# Patient Record
Sex: Female | Born: 2000 | Race: Black or African American | Hispanic: No | Marital: Single | Smoking: Never smoker
Health system: Southern US, Community
[De-identification: ages and names within clinical notes are randomized; demographics above are authoritative.]

## PROBLEM LIST (undated history)

## (undated) HISTORY — PX: OTHER SURGICAL HISTORY: SHX169

---
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
## 2022-05-16 ENCOUNTER — Encounter: Payer: Self-pay | Admitting: Internal Medicine

## 2022-05-16 ENCOUNTER — Ambulatory Visit (INDEPENDENT_AMBULATORY_CARE_PROVIDER_SITE_OTHER): Payer: BC Managed Care – PPO | Admitting: Internal Medicine

## 2022-05-16 VITALS — BP 111/69 | HR 91 | Ht 66.0 in | Wt 110.4 lb

## 2022-05-16 DIAGNOSIS — Z0001 Encounter for general adult medical examination with abnormal findings: Secondary | ICD-10-CM | POA: Diagnosis not present

## 2022-05-16 DIAGNOSIS — R636 Underweight: Secondary | ICD-10-CM | POA: Diagnosis not present

## 2022-05-16 DIAGNOSIS — Z1159 Encounter for screening for other viral diseases: Secondary | ICD-10-CM

## 2022-05-16 DIAGNOSIS — Z114 Encounter for screening for human immunodeficiency virus [HIV]: Secondary | ICD-10-CM | POA: Diagnosis not present

## 2022-05-16 DIAGNOSIS — G8929 Other chronic pain: Secondary | ICD-10-CM | POA: Insufficient documentation

## 2022-05-16 DIAGNOSIS — Z23 Encounter for immunization: Secondary | ICD-10-CM | POA: Diagnosis not present

## 2022-05-16 DIAGNOSIS — M545 Low back pain, unspecified: Secondary | ICD-10-CM | POA: Diagnosis not present

## 2022-05-16 NOTE — Assessment & Plan Note (Signed)
Reports chronic low back pain, likely due to heavy lifting at her previous job Prefers to avoid oral agent Advised to apply IcyHot or Voltaren gel Avoid heavy lifting and frequent bending Simple back exercises

## 2022-05-16 NOTE — Patient Instructions (Addendum)
Please apply Voltaren gel for back pain.  Please start taking protein supplements once daily.  Please take at least 3 meals in a day and avoid skipping any meals.  HPV vaccine series: Dose 1: Today Dose 2: After 1 month (06/16/22) Dose 3: After 6 months (11/16/22)

## 2022-05-16 NOTE — Progress Notes (Signed)
New Patient Office Visit  Subjective:  Patient ID: Jodi Lowe, female    DOB: [birth year]  Age: 22 y.o. MRN: [id number]  CC:  Chief Complaint  Patient presents with   Annual Exam    HPI Jodi Lowe is a 22 y.o. female with no significant past medical history who presents for establishing care and annual exam.  She reports chronic low back pain, likely due to heavy lifting at [employer].  She is not working at [employer] now.  Denies any recent injury.  Her back pain is intermittent, worse with bending and better with rest.  She has not tried any OTC medicines yet.  Denies any saddle anesthesia, urinary or stool incontinence, numbness or tingling of the LE.  She reports vaping almost on a daily basis, but denies smoking cigarettes.  Denies any dyspnea or wheezing currently.  History reviewed. No pertinent past medical history.  Past Surgical History:  Procedure Laterality Date   Finger reset     Pts. pinky finger had to be reset back when she was in [employer]    Family History  Problem Relation Age of Onset   Diabetes Mother    Hypertension Mother    Hypertension Father    Sickle cell trait Father     Social History   Socioeconomic History   Marital status: Single    Spouse name: Not on file   Number of children: Not on file   Years of education: Not on file   Highest education level: Not on file  Occupational History   Not on file  Tobacco Use   Smoking status: Never   Smokeless tobacco: Never  Vaping Use   Vaping Use: Every day   Substances: Nicotine, THC, Flavoring  Substance and Sexual Activity   Alcohol use: No    Comment: occ   Drug use: Yes    Frequency: 7.0 times per week    Types: Marijuana   Sexual activity: Never  Other Topics Concern   Not on file  Social History Narrative   Not on file   Social Determinants of Health   Financial Resource Strain: Not on file  Food Insecurity: Not on file  Transportation Needs: Not on file  Physical  Activity: Not on file  Stress: Not on file  Social Connections: Not on file  Intimate Partner Violence: Not on file    ROS Review of Systems  Constitutional:  Negative for chills and fever.  HENT:  Negative for congestion, sinus pressure, sinus pain and sore throat.   Eyes:  Negative for pain and discharge.  Respiratory:  Negative for cough and shortness of breath.   Cardiovascular:  Negative for chest pain and palpitations.  Gastrointestinal:  Negative for abdominal pain, constipation, diarrhea, nausea and vomiting.  Endocrine: Negative for polydipsia and polyuria.  Genitourinary:  Negative for dysuria and hematuria.  Musculoskeletal:  Positive for back pain. Negative for neck pain and neck stiffness.  Skin:  Negative for rash.  Neurological:  Negative for dizziness and weakness.  Psychiatric/Behavioral:  Negative for agitation and behavioral problems.     Objective:   Today's Vitals: BP 111/69 (BP Location: Right Arm, Patient Position: Sitting, Cuff Size: Normal)   Pulse 91   Ht '5\' 6"'$  (1.676 m)   Wt 110 lb 6.4 oz (50.1 kg)   SpO2 96%   BMI 17.82 kg/m   Physical Exam Vitals reviewed.  Constitutional:      General: She is not in acute distress.  Appearance: She is not diaphoretic.  HENT:     Head: Normocephalic and atraumatic.     Nose: Nose normal.     Mouth/Throat:     Mouth: Mucous membranes are moist.  Eyes:     General: No scleral icterus.    Extraocular Movements: Extraocular movements intact.  Cardiovascular:     Rate and Rhythm: Normal rate and regular rhythm.     Heart sounds: Normal heart sounds. No murmur heard. Pulmonary:     Breath sounds: Normal breath sounds. No wheezing or rales.  Abdominal:     Palpations: Abdomen is soft.     Tenderness: There is no abdominal tenderness.  Musculoskeletal:     Cervical back: Neck supple. No tenderness.     Lumbar back: No tenderness.     Right lower leg: No edema.     Left lower leg: No edema.  Skin:     General: Skin is warm.     Findings: No rash.     Comments: Tattoos over b/l forearm  Neurological:     General: No focal deficit present.     Mental Status: She is alert and oriented to person, place, and time.  Psychiatric:        Mood and Affect: Mood normal.        Behavior: Behavior normal.     Assessment & Plan:   Problem List Items Addressed This Visit       Other   Encounter for general adult medical examination with abnormal findings - Primary    Physical exam as documented. Fasting blood tests today. HPV vaccine #1 today.       Relevant Orders   CBC with Differential/Platelet   Basic Metabolic Panel (BMET)   Chronic low back pain without sciatica    Reports chronic low back pain, likely due to heavy lifting at her previous job Prefers to avoid oral agent Advised to apply IcyHot or Voltaren gel Avoid heavy lifting and frequent bending Simple back exercises      Underweight    Skips breakfast, advised to take at least 3 meals in a day Avoid skipping meals Needs to take protein supplement Needs to cut down to quit vaping      Other Visit Diagnoses     Need for hepatitis C screening test       Relevant Orders   Hepatitis C Antibody   Screening for HIV (human immunodeficiency virus)       Relevant Orders   HIV antibody (with reflex)   Need for HPV vaccination       Relevant Orders   HPV 9-valent vaccine,Recombinat (Completed)       Outpatient Encounter Medications as of 05/16/2022  Medication Sig   Ascorbic Acid (VITAMIN C) 1000 MG tablet Take 1,000 mg by mouth daily. When having cold symptoms. (Patient not taking: Reported on 05/16/2022)   ondansetron (ZOFRAN ODT) 4 MG disintegrating tablet '4mg'$  ODT q6 hours prn nausea/vomit (Patient not taking: Reported on 05/16/2022)   No facility-administered encounter medications on file as of 05/16/2022.    Follow-up: Return in about 1 year (around 05/16/2023) for Annual exam.   Lindell Spar, MD

## 2022-05-16 NOTE — Assessment & Plan Note (Signed)
Physical exam as documented. Fasting blood tests today. HPV vaccine #1 today.

## 2022-05-16 NOTE — Assessment & Plan Note (Signed)
Skips breakfast, advised to take at least 3 meals in a day Avoid skipping meals Needs to take protein supplement Needs to cut down to quit vaping

## 2022-05-17 LAB — CBC WITH DIFFERENTIAL/PLATELET
Basophils Absolute: 0 10*3/uL (ref 0.0–0.2)
Basos: 0 %
EOS (ABSOLUTE): 0 10*3/uL (ref 0.0–0.4)
Eos: 1 %
Hematocrit: 38.7 % (ref 34.0–46.6)
Hemoglobin: 12.9 g/dL (ref 11.1–15.9)
Immature Grans (Abs): 0 10*3/uL (ref 0.0–0.1)
Immature Granulocytes: 0 %
Lymphocytes Absolute: 1.5 10*3/uL (ref 0.7–3.1)
Lymphs: 24 %
MCH: 28.9 pg (ref 26.6–33.0)
MCHC: 33.3 g/dL (ref 31.5–35.7)
MCV: 87 fL (ref 79–97)
Monocytes Absolute: 0.3 10*3/uL (ref 0.1–0.9)
Monocytes: 5 %
Neutrophils Absolute: 4.3 10*3/uL (ref 1.4–7.0)
Neutrophils: 70 %
Platelets: 198 10*3/uL (ref 150–450)
RBC: 4.46 x10E6/uL (ref 3.77–5.28)
RDW: 15.2 % (ref 11.7–15.4)
WBC: 6.2 10*3/uL (ref 3.4–10.8)

## 2022-05-17 LAB — BASIC METABOLIC PANEL
BUN/Creatinine Ratio: 12 (ref 9–23)
BUN: 10 mg/dL (ref 6–20)
CO2: 25 mmol/L (ref 20–29)
Calcium: 9.5 mg/dL (ref 8.7–10.2)
Chloride: 103 mmol/L (ref 96–106)
Creatinine, Ser: 0.82 mg/dL (ref 0.57–1.00)
Glucose: 84 mg/dL (ref 70–99)
Potassium: 4.9 mmol/L (ref 3.5–5.2)
Sodium: 140 mmol/L (ref 134–144)
eGFR: 105 mL/min/{1.73_m2} (ref >59)

## 2022-05-17 LAB — HEPATITIS C ANTIBODY: Hep C Virus Ab: NONREACTIVE

## 2022-05-17 LAB — HIV ANTIBODY (ROUTINE TESTING W REFLEX): HIV Screen 4th Generation wRfx: NONREACTIVE

## 2022-05-31 ENCOUNTER — Other Ambulatory Visit: Payer: Self-pay

## 2022-05-31 ENCOUNTER — Encounter (HOSPITAL_COMMUNITY): Payer: Self-pay

## 2022-05-31 ENCOUNTER — Emergency Department (HOSPITAL_COMMUNITY)
Admission: EM | Admit: 2022-05-31 | Discharge: 2022-05-31 | Disposition: A | Discharge status: Home / Self Care | Payer: BC Managed Care – PPO | Attending: Emergency Medicine | Admitting: Emergency Medicine

## 2022-05-31 DIAGNOSIS — F17210 Nicotine dependence, cigarettes, uncomplicated: Secondary | ICD-10-CM | POA: Diagnosis not present

## 2022-05-31 DIAGNOSIS — D72829 Elevated white blood cell count, unspecified: Secondary | ICD-10-CM | POA: Insufficient documentation

## 2022-05-31 DIAGNOSIS — R112 Nausea with vomiting, unspecified: Secondary | ICD-10-CM | POA: Diagnosis not present

## 2022-05-31 DIAGNOSIS — F12188 Cannabis abuse with other cannabis-induced disorder: Secondary | ICD-10-CM | POA: Insufficient documentation

## 2022-05-31 DIAGNOSIS — R9431 Abnormal electrocardiogram [ECG] [EKG]: Secondary | ICD-10-CM | POA: Diagnosis not present

## 2022-05-31 LAB — CBC WITH DIFFERENTIAL/PLATELET
Abs Immature Granulocytes: 0.06 10*3/uL (ref 0.00–0.07)
Basophils Absolute: 0 10*3/uL (ref 0.0–0.1)
Basophils Relative: 0 %
Eosinophils Absolute: 0 10*3/uL (ref 0.0–0.5)
Eosinophils Relative: 0 %
HCT: 35 % — ABNORMAL LOW (ref 36.0–46.0)
Hemoglobin: 11.6 g/dL — ABNORMAL LOW (ref 12.0–15.0)
Immature Granulocytes: 0 %
Lymphocytes Relative: 7 %
Lymphs Abs: 0.9 10*3/uL (ref 0.7–4.0)
MCH: 28.5 pg (ref 26.0–34.0)
MCHC: 33.1 g/dL (ref 30.0–36.0)
MCV: 86 fL (ref 80.0–100.0)
Monocytes Absolute: 0.5 10*3/uL (ref 0.1–1.0)
Monocytes Relative: 4 %
Neutro Abs: 12.2 10*3/uL — ABNORMAL HIGH (ref 1.7–7.7)
Neutrophils Relative %: 89 %
Platelets: 199 10*3/uL (ref 150–400)
RBC: 4.07 MIL/uL (ref 3.87–5.11)
RDW: 14.6 % (ref 11.5–15.5)
WBC: 13.7 10*3/uL — ABNORMAL HIGH (ref 4.0–10.5)
nRBC: 0 % (ref 0.0–0.2)

## 2022-05-31 LAB — URINALYSIS, ROUTINE W REFLEX MICROSCOPIC
Bilirubin Urine: NEGATIVE
Glucose, UA: NEGATIVE mg/dL
Ketones, ur: NEGATIVE mg/dL
Nitrite: NEGATIVE
Protein, ur: 100 mg/dL — AB
RBC / HPF: >50 RBC/hpf (ref 0–5)
Specific Gravity, Urine: 1.014 (ref 1.005–1.030)
pH: 8 (ref 5.0–8.0)

## 2022-05-31 LAB — COMPREHENSIVE METABOLIC PANEL
ALT: 15 U/L (ref 0–44)
AST: 28 U/L (ref 15–41)
Albumin: 4.4 g/dL (ref 3.5–5.0)
Alkaline Phosphatase: 65 U/L (ref 38–126)
Anion gap: 12 (ref 5–15)
BUN: 11 mg/dL (ref 6–20)
CO2: 20 mmol/L — ABNORMAL LOW (ref 22–32)
Calcium: 9.4 mg/dL (ref 8.9–10.3)
Chloride: 108 mmol/L (ref 98–111)
Creatinine, Ser: 0.8 mg/dL (ref 0.44–1.00)
GFR, Estimated: >60 mL/min (ref >60)
Glucose, Bld: 122 mg/dL — ABNORMAL HIGH (ref 70–99)
Potassium: 3.7 mmol/L (ref 3.5–5.1)
Sodium: 140 mmol/L (ref 135–145)
Total Bilirubin: 0.8 mg/dL (ref 0.3–1.2)
Total Protein: 7 g/dL (ref 6.5–8.1)

## 2022-05-31 LAB — HCG, QUANTITATIVE, PREGNANCY: hCG, Beta Chain, Quant, S: <1 m[IU]/mL (ref <5)

## 2022-05-31 MED ORDER — ONDANSETRON 8 MG PO TBDP: 8.0000 mg | ORAL_TABLET | Freq: Three times a day (TID) | ORAL | 0 refills | Status: DC | PRN

## 2022-05-31 MED ORDER — HALOPERIDOL LACTATE 5 MG/ML IJ SOLN: 2.0000 mg | Freq: Once | INTRAMUSCULAR | Status: DC

## 2022-05-31 MED ORDER — FENTANYL CITRATE PF 50 MCG/ML IJ SOSY
50.0000 ug | PREFILLED_SYRINGE | Freq: Once | INTRAMUSCULAR | Status: AC
  Administered 2022-05-31: 50 ug via INTRAVENOUS
  Filled 2022-05-31: qty 1

## 2022-05-31 MED ORDER — LACTATED RINGERS IV BOLUS
1000.0000 mL | Freq: Once | INTRAVENOUS | Status: AC
  Administered 2022-05-31: 1000 mL via INTRAVENOUS

## 2022-05-31 MED ORDER — DROPERIDOL 2.5 MG/ML IJ SOLN
1.2500 mg | Freq: Once | INTRAMUSCULAR | Status: AC
  Administered 2022-05-31: 1.25 mg via INTRAVENOUS
  Filled 2022-05-31: qty 2

## 2022-05-31 MED ORDER — LACTATED RINGERS IV SOLN: INTRAVENOUS | Status: DC

## 2022-05-31 NOTE — ED Provider Notes (Signed)
[place] EMERGENCY DEPARTMENT AT [facility] Provider Note   CSN: [id number] Arrival date & time: 05/31/22  1205     History  Chief Complaint  Patient presents with   Abdominal Pain   Emesis    Jodi Lowe is a 22 y.o. female.  22 year old female presents with 1 day of nausea vomiting.  She also notes diffuse abdominal cramping.  Patient smokes PE marijuana cigarettes a day.  Denies any fever or chills.  Emesis has been nonbilious or bloody.  She is currently on her menstrual cycle.  Denies any urinary symptoms.  Went to urgent care and EMS was called to that location and patient transported here      Home Medications Prior to Admission medications   Medication Sig Start Date End Date Taking? Authorizing Provider  Ascorbic Acid (VITAMIN C) 1000 MG tablet Take 1,000 mg by mouth daily. When having cold symptoms. Patient not taking: Reported on 05/16/2022    [provider]  ondansetron (ZOFRAN ODT) 4 MG disintegrating tablet 4mg  ODT q6 hours prn nausea/vomit Patient not taking: Reported on 05/16/2022 02/22/13   Drenda Freeze, MD      Allergies    Amoxicillin    Review of Systems   Review of Systems  All other systems reviewed and are negative.   Physical Exam Updated Vital Signs BP 120/89   Pulse (!) 45   Temp 97.6 F (36.4 C)   Resp 20   Ht 1.676 m (5\' 6" )   Wt 49.9 kg   SpO2 100%   BMI 17.75 kg/m  Physical Exam Vitals and nursing note reviewed.  Constitutional:      General: She is not in acute distress.    Appearance: Normal appearance. She is well-developed. She is not toxic-appearing.  HENT:     Head: Normocephalic and atraumatic.  Eyes:     General: Lids are normal.     Conjunctiva/sclera: Conjunctivae normal.     Pupils: Pupils are equal, round, and reactive to light.  Neck:     Thyroid: No thyroid mass.     Trachea: No tracheal deviation.  Cardiovascular:     Rate and Rhythm: Normal rate and regular rhythm.      Heart sounds: Normal heart sounds. No murmur heard.    No gallop.  Pulmonary:     Effort: Pulmonary effort is normal. No respiratory distress.     Breath sounds: Normal breath sounds. No stridor. No decreased breath sounds, wheezing, rhonchi or rales.  Abdominal:     General: There is no distension.     Palpations: Abdomen is soft.     Tenderness: There is no abdominal tenderness. There is no rebound.    Musculoskeletal:        General: No tenderness. Normal range of motion.     Cervical back: Normal range of motion and neck supple.  Skin:    General: Skin is warm and dry.     Findings: No abrasion or rash.  Neurological:     Mental Status: She is alert and oriented to person, place, and time. Mental status is at baseline.     GCS: GCS eye subscore is 4. GCS verbal subscore is 5. GCS motor subscore is 6.     Cranial Nerves: No cranial nerve deficit.     Sensory: No sensory deficit.     Motor: Motor function is intact.  Psychiatric:        Attention and Perception: Attention normal.  Speech: Speech normal.        Behavior: Behavior normal.    ED Results / Procedures / Treatments   Labs (all labs ordered are listed, but only abnormal results are displayed) Labs Reviewed - No data to display  EKG EKG Interpretation  Date/Time:  Thursday May 31 2022 12:19:27 EDT Ventricular Rate:  54 PR Interval:  104 QRS Duration: 79 QT Interval:  428 QTC Calculation: 406 R Axis:   77 Text Interpretation: Sinus rhythm Atrial premature complexes Short PR interval ST elev, probable normal early repol pattern Confirmed by Lacretia Leigh ([id number]) on 05/31/2022 3:23:47 PM  Radiology No results found.  Procedures Procedures    Medications Ordered in ED Medications  lactated ringers infusion (has no administration in time range)  lactated ringers bolus 1,000 mL (has no administration in time range)  haloperidol lactate (HALDOL) injection 2 mg (has no administration in time range)     ED Course/ Medical Decision Making/ A&P                             Medical Decision Making Amount and/or Complexity of Data Reviewed Labs: ordered.  Risk Prescription drug management.   Patient is EKG my interpretation shows no ischemic changes.  Patient medicated with droperidol and fentanyl and feels better.  I will give IV fluids.  Laboratory studies noted for slight leukocytosis.  She has no focal tenderness.  Urinalysis with possible infection.  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  Abdominal exam repeated after medication and is benign at this point.  Patient does not appear to have a surgical abdomen at this time.  Will discharge home with antiemetics patient counseled on dangers of marijuana use        Final Clinical Impression(s) / ED Diagnoses Final diagnoses:  None    Rx / DC Orders ED Discharge Orders     None         Lacretia Leigh, MD 05/31/22 1525

## 2022-05-31 NOTE — ED Triage Notes (Signed)
BIB EMS from urgent care for generalized abdominal pain and vomiting since this AM. No diarrhea. No other c/o

## 2022-08-05 IMAGING — DX DG HAND COMPLETE 3+V*L*
4 series · 4 of 4 positions shown · non-contrast
Comparison: None.

CLINICAL DATA: Bilateral hand pain, across all 4 MCP joints on rt
hand worse on middle finger, left hand pain to 0rd-2th MCP joints.

EXAM:
LEFT HAND - COMPLETE 3+ VIEW

[hand pa]
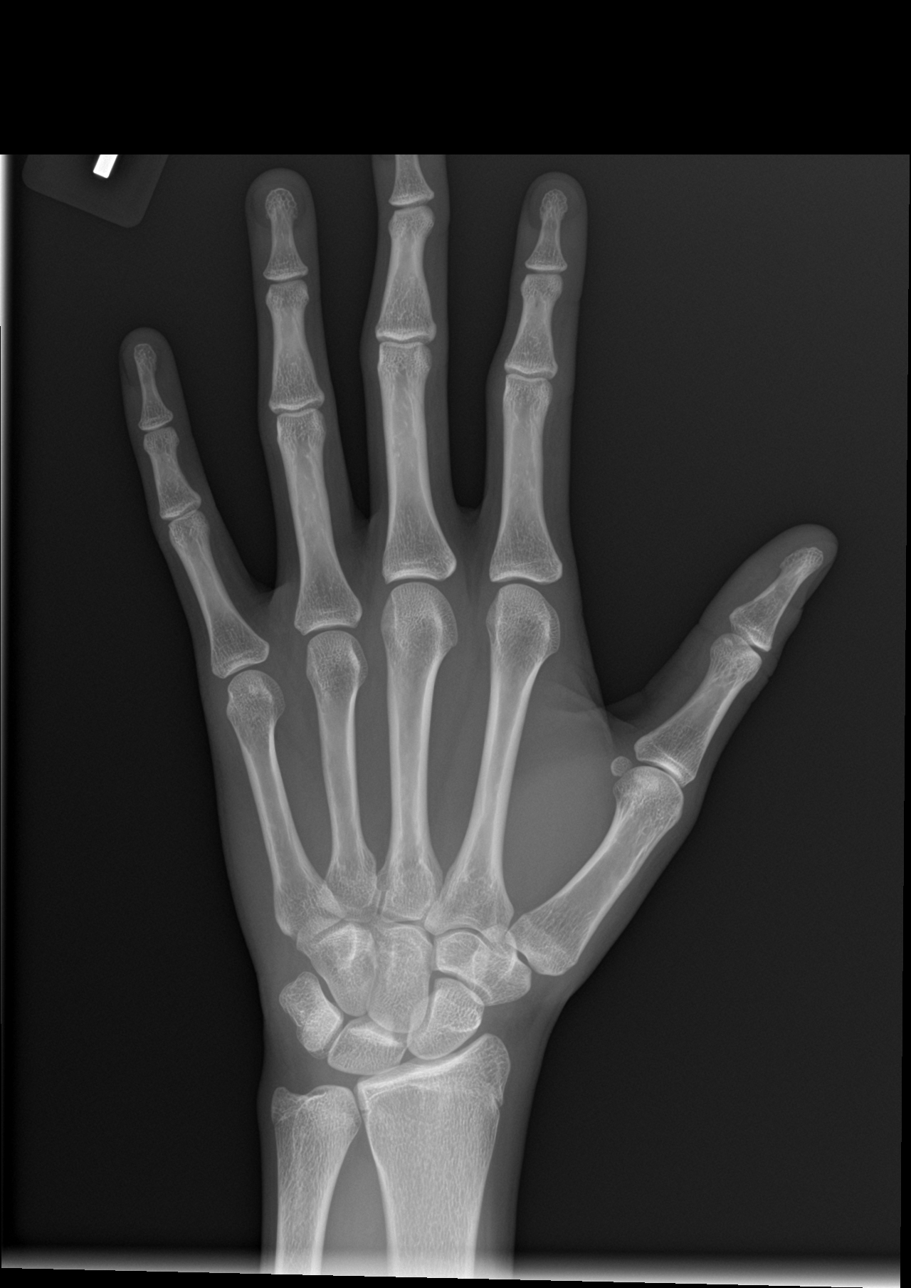

[hand obl]
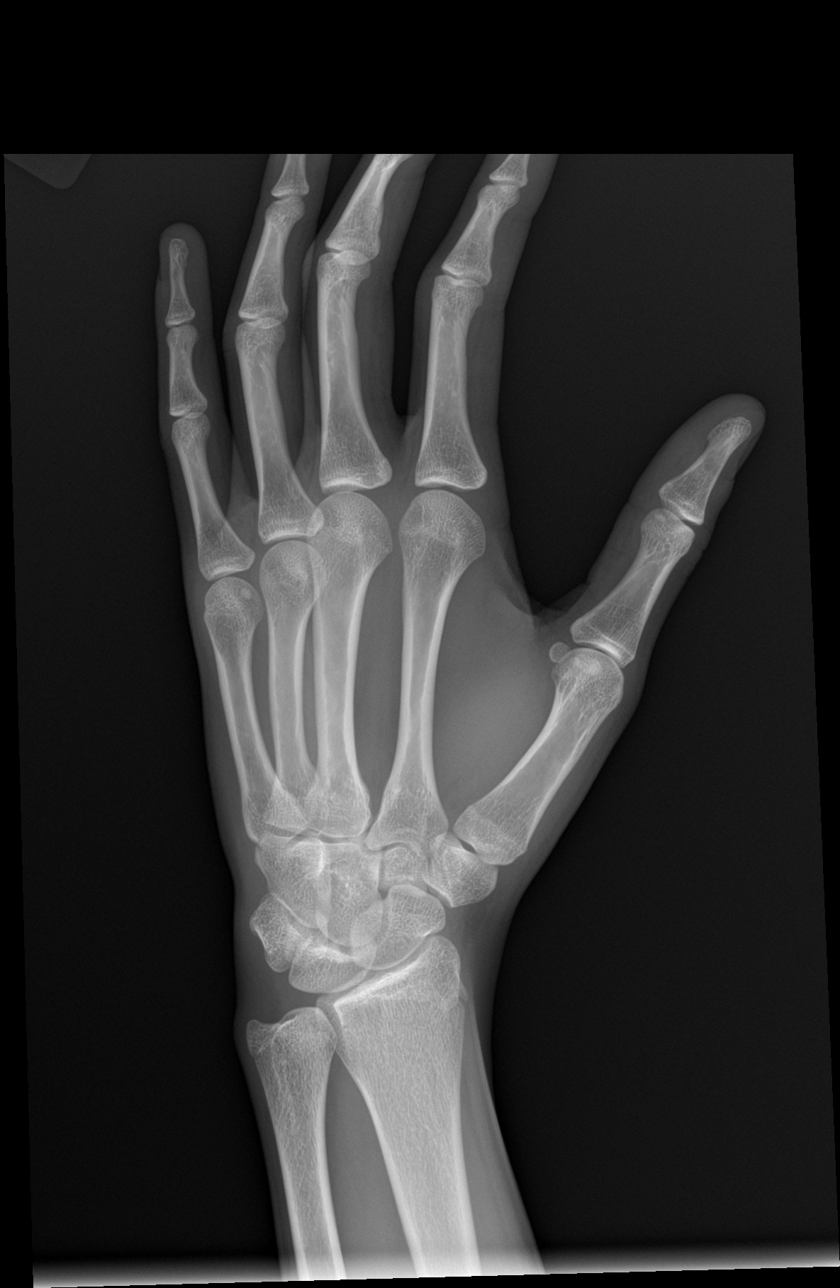

[hand lat (1 of 2)]
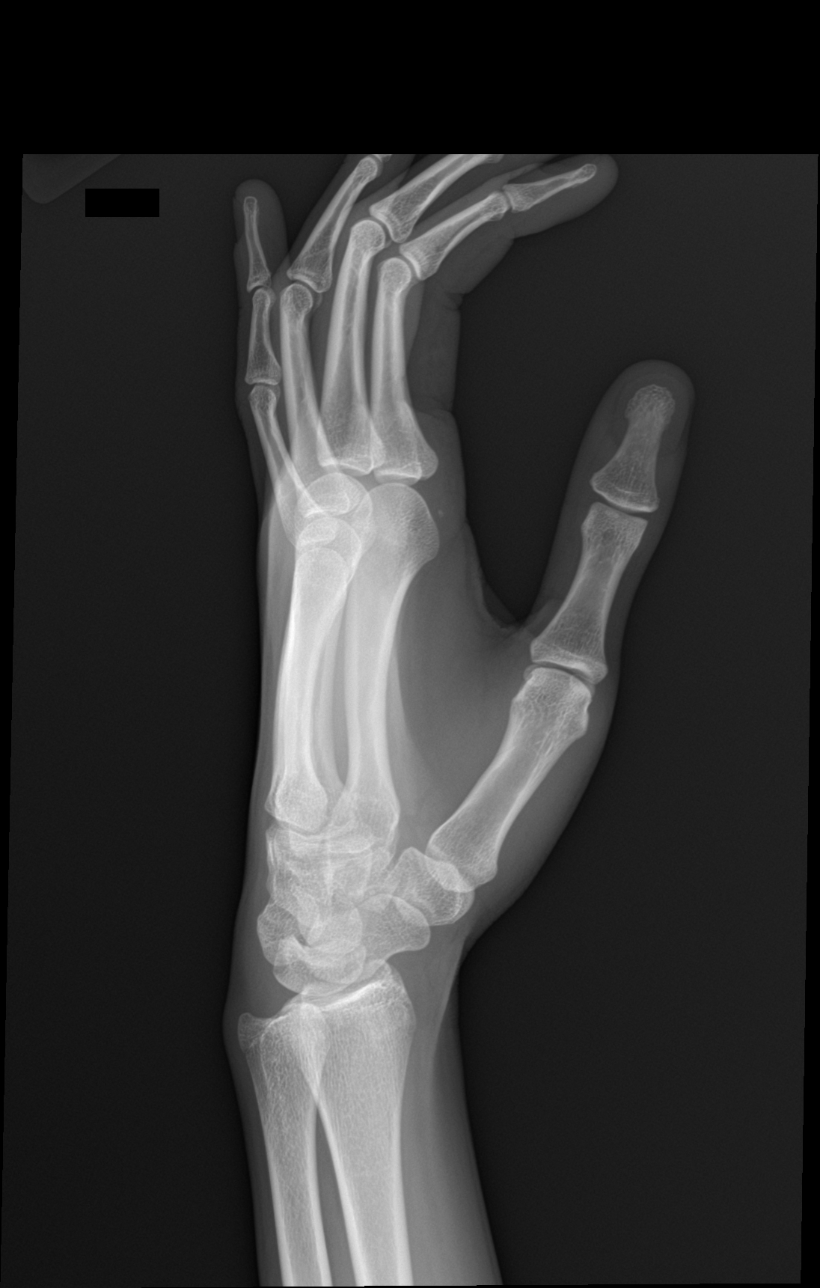

[hand lat (2 of 2)]
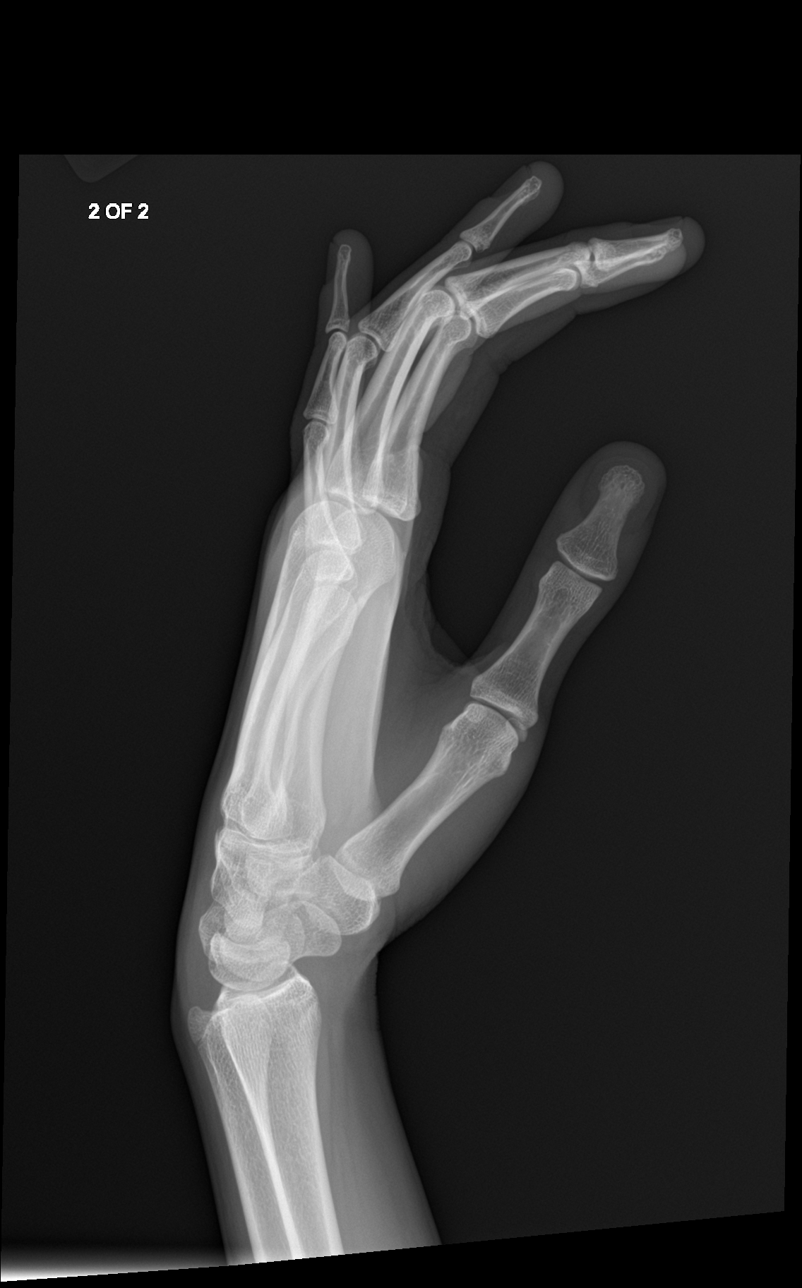

[4 of 4 positions shown; findings below may reference images not displayed]

FINDINGS: There is no evidence of fracture or dislocation. There is no
evidence of arthropathy or other focal bone abnormality. Soft
tissues are unremarkable.
IMPRESSION: Negative.

## 2023-01-03 DIAGNOSIS — S63502A Unspecified sprain of left wrist, initial encounter: Secondary | ICD-10-CM | POA: Diagnosis not present

## 2023-01-03 DIAGNOSIS — S6992XA Unspecified injury of left wrist, hand and finger(s), initial encounter: Secondary | ICD-10-CM | POA: Diagnosis not present

## 2023-01-03 DIAGNOSIS — S66912A Strain of unspecified muscle, fascia and tendon at wrist and hand level, left hand, initial encounter: Secondary | ICD-10-CM | POA: Diagnosis not present

## 2023-01-03 DIAGNOSIS — S6392XA Sprain of unspecified part of left wrist and hand, initial encounter: Secondary | ICD-10-CM | POA: Diagnosis not present

## 2023-01-08 DIAGNOSIS — M542 Cervicalgia: Secondary | ICD-10-CM | POA: Diagnosis not present

## 2023-01-08 DIAGNOSIS — R29898 Other symptoms and signs involving the musculoskeletal system: Secondary | ICD-10-CM | POA: Diagnosis not present

## 2023-01-08 DIAGNOSIS — M25532 Pain in left wrist: Secondary | ICD-10-CM | POA: Diagnosis not present

## 2023-01-08 DIAGNOSIS — M25432 Effusion, left wrist: Secondary | ICD-10-CM | POA: Diagnosis not present

## 2023-01-16 DIAGNOSIS — R202 Paresthesia of skin: Secondary | ICD-10-CM | POA: Diagnosis not present

## 2023-01-16 DIAGNOSIS — M542 Cervicalgia: Secondary | ICD-10-CM | POA: Diagnosis not present

## 2023-01-16 DIAGNOSIS — M47812 Spondylosis without myelopathy or radiculopathy, cervical region: Secondary | ICD-10-CM | POA: Diagnosis not present

## 2023-01-16 DIAGNOSIS — M25532 Pain in left wrist: Secondary | ICD-10-CM | POA: Diagnosis not present

## 2023-01-16 DIAGNOSIS — R200 Anesthesia of skin: Secondary | ICD-10-CM | POA: Diagnosis not present

## 2023-01-16 DIAGNOSIS — M5030 Other cervical disc degeneration, unspecified cervical region: Secondary | ICD-10-CM | POA: Diagnosis not present

## 2023-01-16 DIAGNOSIS — M25432 Effusion, left wrist: Secondary | ICD-10-CM | POA: Diagnosis not present

## 2023-01-16 DIAGNOSIS — R531 Weakness: Secondary | ICD-10-CM | POA: Diagnosis not present

## 2023-01-16 DIAGNOSIS — M50222 Other cervical disc displacement at C5-C6 level: Secondary | ICD-10-CM | POA: Diagnosis not present

## 2023-01-16 DIAGNOSIS — R29898 Other symptoms and signs involving the musculoskeletal system: Secondary | ICD-10-CM | POA: Diagnosis not present

## 2023-01-16 DIAGNOSIS — M50221 Other cervical disc displacement at C4-C5 level: Secondary | ICD-10-CM | POA: Diagnosis not present

## 2023-01-18 DIAGNOSIS — M542 Cervicalgia: Secondary | ICD-10-CM | POA: Diagnosis not present

## 2023-01-18 DIAGNOSIS — R29898 Other symptoms and signs involving the musculoskeletal system: Secondary | ICD-10-CM | POA: Diagnosis not present

## 2023-01-18 DIAGNOSIS — M25432 Effusion, left wrist: Secondary | ICD-10-CM | POA: Diagnosis not present

## 2023-02-15 DIAGNOSIS — R29898 Other symptoms and signs involving the musculoskeletal system: Secondary | ICD-10-CM | POA: Diagnosis not present

## 2023-02-15 DIAGNOSIS — M25532 Pain in left wrist: Secondary | ICD-10-CM | POA: Diagnosis not present

## 2023-02-15 DIAGNOSIS — M25432 Effusion, left wrist: Secondary | ICD-10-CM | POA: Diagnosis not present

## 2023-02-15 DIAGNOSIS — M542 Cervicalgia: Secondary | ICD-10-CM | POA: Diagnosis not present

## 2023-05-21 ENCOUNTER — Encounter: Payer: BC Managed Care – PPO | Admitting: Internal Medicine

## 2023-05-28 ENCOUNTER — Encounter: Payer: Self-pay | Admitting: Internal Medicine

## 2023-06-28 ENCOUNTER — Encounter (HOSPITAL_COMMUNITY): Payer: Self-pay

## 2023-06-28 ENCOUNTER — Other Ambulatory Visit: Payer: Self-pay

## 2023-06-28 ENCOUNTER — Emergency Department (HOSPITAL_COMMUNITY)
Admission: EM | Admit: 2023-06-28 | Discharge: 2023-06-28 | Disposition: A | Discharge status: Home / Self Care | Attending: Emergency Medicine | Admitting: Emergency Medicine

## 2023-06-28 DIAGNOSIS — J0100 Acute maxillary sinusitis, unspecified: Secondary | ICD-10-CM | POA: Diagnosis not present

## 2023-06-28 DIAGNOSIS — R509 Fever, unspecified: Secondary | ICD-10-CM | POA: Diagnosis not present

## 2023-06-28 DIAGNOSIS — J329 Chronic sinusitis, unspecified: Secondary | ICD-10-CM | POA: Insufficient documentation

## 2023-06-28 MED ORDER — DOXYCYCLINE HYCLATE 100 MG PO CAPS: 100.0000 mg | ORAL_CAPSULE | Freq: Two times a day (BID) | ORAL | 0 refills | Status: DC

## 2023-06-28 MED ORDER — PREDNISONE 10 MG PO TABS: 40.0000 mg | ORAL_TABLET | Freq: Every day | ORAL | 0 refills | Status: DC

## 2023-06-28 MED ORDER — PREDNISONE 10 MG PO TABS: 40.0000 mg | ORAL_TABLET | Freq: Every day | ORAL | 0 refills | Status: AC

## 2023-06-28 NOTE — Discharge Instructions (Signed)
 Please take all antibiotics as directed.  Return to emergency department immediately for any new or worsening symptoms.  Follow-up closely with your primary care doctor on an outpatient basis.

## 2023-06-28 NOTE — ED Provider Notes (Signed)
 [place] EMERGENCY DEPARTMENT AT [facility] Provider Note   CSN: [id number] Arrival date & time: 06/28/23  1711     History  Chief Complaint  Patient presents with   Sinusitis    Jodi Lowe  is a 23 y.o. female.  Patient is a 23 year old female who presents to the emergency department with a chief complaint of sinus pressure, intermittent fevers, rhinorrhea which has been ongoing for over a week.  Patient denies any associated cough, congestion, sore throat.  She denies any chest pain or shortness of breath.  There is been no abdominal pain, nausea, vomiting, diarrhea.   Sinusitis Associated symptoms: rhinorrhea        Home Medications Prior to Admission medications   Medication Sig Start Date End Date Taking? Authorizing Provider  Ascorbic Acid (VITAMIN C) 1000 MG tablet Take 1,000 mg by mouth daily. When having cold symptoms. Patient not taking: Reported on 05/16/2022    [provider]  ondansetron  (ZOFRAN  ODT) 4 MG disintegrating tablet 4mg  ODT q6 hours prn nausea/vomit Patient not taking: Reported on 05/16/2022 02/22/13   Dalene Duck, MD  ondansetron  (ZOFRAN -ODT) 8 MG disintegrating tablet Take 1 tablet (8 mg total) by mouth every 8 (eight) hours as needed for nausea or vomiting. 05/31/22   Lind Repine, MD      Allergies    Amoxicillin    Review of Systems   Review of Systems  HENT:  Positive for rhinorrhea and sinus pressure.     Physical Exam Updated Vital Signs BP 119/72   Pulse 85   Temp 98.3 F (36.8 C) (Oral)   Ht 5\' 6"  (1.676 m)   Wt 47.6 kg   SpO2 100%   BMI 16.95 kg/m  Physical Exam Vitals and nursing note reviewed.  Constitutional:      Appearance: Normal appearance.  HENT:     Head: Normocephalic and atraumatic.     Nose: Congestion and rhinorrhea present.     Comments: Tender to palpation over maxillary sinuses    Mouth/Throat:     Mouth: Mucous membranes are moist.  Eyes:     Extraocular Movements:  Extraocular movements intact.     Conjunctiva/sclera: Conjunctivae normal.     Pupils: Pupils are equal, round, and reactive to light.  Cardiovascular:     Rate and Rhythm: Normal rate and regular rhythm.     Pulses: Normal pulses.     Heart sounds: Normal heart sounds. No murmur heard. Pulmonary:     Effort: Pulmonary effort is normal. No respiratory distress.     Breath sounds: Normal breath sounds. No stridor. No wheezing, rhonchi or rales.  Musculoskeletal:        General: Normal range of motion.     Cervical back: Normal range of motion and neck supple.  Skin:    General: Skin is warm and dry.     Findings: No rash.  Neurological:     General: No focal deficit present.     Mental Status: She is alert and oriented to person, place, and time. Mental status is at baseline.  Psychiatric:        Mood and Affect: Mood normal.        Behavior: Behavior normal.        Thought Content: Thought content normal.        Judgment: Judgment normal.     ED Results / Procedures / Treatments   Labs (all labs ordered are listed, but only abnormal results are displayed) Labs  Reviewed - No data to display  EKG None  Radiology No results found.  Procedures Procedures    Medications Ordered in ED Medications - No data to display  ED Course/ Medical Decision Making/ A&P                                 Medical Decision Making Patient is doing well at this time and is stable for discharge home.  Discussed with patient that we will cover her for sinusitis at this time given duration of symptoms and intermittent fevers.  Patient has no clinical indication of underlying abscess at this point.  Vital signs are stable with no indication for sepsis.  The need for close follow-up with primary care doctor was discussed as well as strict turn precautions for any new or worsening symptoms.  Do not suspect any further workup is warranted on an emergent basis.  Patient voiced understanding and had  no additional questions.           Final Clinical Impression(s) / ED Diagnoses Final diagnoses:  None    Rx / DC Orders ED Discharge Orders     None         Emmalene Hare 06/28/23 1736    Sueellen Emery, MD 06/29/23 1513

## 2023-06-28 NOTE — ED Triage Notes (Signed)
 Pt stated that she has been blowing her nose for a week now and believes she may have a sinus infection

## 2023-11-11 ENCOUNTER — Ambulatory Visit (INDEPENDENT_AMBULATORY_CARE_PROVIDER_SITE_OTHER): Admitting: Internal Medicine

## 2023-11-11 ENCOUNTER — Encounter: Payer: Self-pay | Admitting: Internal Medicine

## 2023-11-11 VITALS — BP 100/65 | HR 87 | Ht 66.0 in | Wt 107.0 lb

## 2023-11-11 DIAGNOSIS — Z23 Encounter for immunization: Secondary | ICD-10-CM

## 2023-11-11 DIAGNOSIS — Z0001 Encounter for general adult medical examination with abnormal findings: Secondary | ICD-10-CM

## 2023-11-11 DIAGNOSIS — R739 Hyperglycemia, unspecified: Secondary | ICD-10-CM

## 2023-11-11 DIAGNOSIS — Z7289 Other problems related to lifestyle: Secondary | ICD-10-CM

## 2023-11-11 DIAGNOSIS — R636 Underweight: Secondary | ICD-10-CM | POA: Diagnosis not present

## 2023-11-11 NOTE — Patient Instructions (Addendum)
 Please schedule Pap with np.  Please get last dose of HPV vaccine after 5 months (around 04/12/24).  Please maintain at least 64 ounces of fluid intake in a day.

## 2023-11-11 NOTE — Progress Notes (Unsigned)
 New Patient Office Visit  Subjective:  Patient ID: Jodi Lowe , female    DOB: 07/23/2000  Age: 23 y.o. MRN: [id number]  CC:  Chief Complaint  Patient presents with   Annual Exam    CPE     HPI Jodi Lowe  is a 23 y.o. female with no significant past medical history who presents for establishing care and annual exam.  She reports chronic low back pain, likely due to heavy lifting at [employer].  She is not working at [employer] now.  Denies any recent injury.  Her back pain is intermittent, worse with bending and better with rest.  She has not tried any OTC medicines yet.  Denies any saddle anesthesia, urinary or stool incontinence, numbness or tingling of the LE.  She reports vaping almost on a daily basis, but denies smoking cigarettes.  Denies any dyspnea or wheezing currently.  History reviewed. No pertinent past medical history.  Past Surgical History:  Procedure Laterality Date   Finger reset     Pts. pinky finger had to be reset back when she was in [employer]    Family History  Problem Relation Age of Onset   Diabetes Mother    Hypertension Mother    Hypertension Father    Sickle cell trait Father     Social History   Socioeconomic History   Marital status: Single    Spouse name: Not on file   Number of children: Not on file   Years of education: Not on file   Highest education level: Not on file  Occupational History   Not on file  Tobacco Use   Smoking status: Never   Smokeless tobacco: Never  Vaping Use   Vaping status: Every Day   Substances: Nicotine, THC, Flavoring  Substance and Sexual Activity   Alcohol use: No    Comment: occ   Drug use: Yes    Frequency: 7.0 times per week    Types: Marijuana   Sexual activity: Never  Other Topics Concern   Not on file  Social History Narrative   Not on file   Social Drivers of Health   Financial Resource Strain: Not on file  Food Insecurity: Not on file  Transportation Needs: Not on file   Physical Activity: Not on file  Stress: Not on file  Social Connections: Not on file  Intimate Partner Violence: Not on file    ROS Review of Systems  Constitutional:  Negative for chills and fever.  HENT:  Negative for congestion, sinus pressure, sinus pain and sore throat.   Eyes:  Negative for pain and discharge.  Respiratory:  Negative for cough and shortness of breath.   Cardiovascular:  Negative for chest pain and palpitations.  Gastrointestinal:  Negative for abdominal pain, constipation, diarrhea, nausea and vomiting.  Endocrine: Negative for polydipsia and polyuria.  Genitourinary:  Negative for dysuria and hematuria.  Musculoskeletal:  Positive for back pain. Negative for neck pain and neck stiffness.  Skin:  Negative for rash.  Neurological:  Negative for dizziness and weakness.  Psychiatric/Behavioral:  Negative for agitation and behavioral problems.     Objective:   Today's Vitals: BP 100/65   Pulse 87   Ht 5' 6 (1.676 m)   Wt 107 lb (48.5 kg)   SpO2 98%   BMI 17.27 kg/m   Physical Exam Vitals reviewed.  Constitutional:      General: She is not in acute distress.    Appearance: She is not diaphoretic.  HENT:  Head: Normocephalic and atraumatic.     Nose: Nose normal.     Mouth/Throat:     Mouth: Mucous membranes are moist.  Eyes:     General: No scleral icterus.    Extraocular Movements: Extraocular movements intact.  Cardiovascular:     Rate and Rhythm: Normal rate and regular rhythm.     Heart sounds: Normal heart sounds. No murmur heard. Pulmonary:     Breath sounds: Normal breath sounds. No wheezing or rales.  Abdominal:     Palpations: Abdomen is soft.     Tenderness: There is no abdominal tenderness.  Musculoskeletal:     Cervical back: Neck supple. No tenderness.     Lumbar back: No tenderness.     Right lower leg: No edema.     Left lower leg: No edema.  Skin:    General: Skin is warm.     Findings: No rash.     Comments: Tattoos  over b/l forearm  Neurological:     General: No focal deficit present.     Mental Status: She is alert and oriented to person, place, and time.  Psychiatric:        Mood and Affect: Mood normal.        Behavior: Behavior normal.     Assessment & Plan:   Problem List Items Addressed This Visit       Other   Encounter for general adult medical examination with abnormal findings - Primary    Outpatient Encounter Medications as of 11/11/2023  Medication Sig   Ascorbic Acid (VITAMIN C) 1000 MG tablet Take 1,000 mg by mouth daily. When having cold symptoms. (Patient not taking: Reported on 05/16/2022)   doxycycline  (VIBRAMYCIN ) 100 MG capsule Take 1 capsule (100 mg total) by mouth 2 (two) times daily.   ondansetron  (ZOFRAN  ODT) 4 MG disintegrating tablet 4mg  ODT q6 hours prn nausea/vomit (Patient not taking: Reported on 05/16/2022)   ondansetron  (ZOFRAN -ODT) 8 MG disintegrating tablet Take 1 tablet (8 mg total) by mouth every 8 (eight) hours as needed for nausea or vomiting.   No facility-administered encounter medications on file as of 11/11/2023.    Follow-up: No follow-ups on file.   Suzzane MARLA Blanch, MD

## 2023-11-14 DIAGNOSIS — Z7289 Other problems related to lifestyle: Secondary | ICD-10-CM | POA: Insufficient documentation

## 2023-11-14 NOTE — Assessment & Plan Note (Signed)
 Advised to cut down -> quit vaping, she expressed understanding.

## 2023-11-14 NOTE — Assessment & Plan Note (Signed)
 Physical exam as documented. Fasting blood tests today. HPV vaccine #2 today.

## 2023-11-14 NOTE — Assessment & Plan Note (Signed)
Skips breakfast, advised to take at least 3 meals in a day Avoid skipping meals Needs to take protein supplement Needs to cut down to quit vaping

## 2024-01-20 ENCOUNTER — Emergency Department (HOSPITAL_COMMUNITY)
Admission: EM | Admit: 2024-01-20 | Discharge: 2024-01-20 | Disposition: A | Discharge status: Home / Self Care | Attending: Emergency Medicine | Admitting: Emergency Medicine

## 2024-01-20 ENCOUNTER — Encounter (HOSPITAL_COMMUNITY): Payer: Self-pay | Admitting: Emergency Medicine

## 2024-01-20 ENCOUNTER — Other Ambulatory Visit: Payer: Self-pay

## 2024-01-20 DIAGNOSIS — R739 Hyperglycemia, unspecified: Secondary | ICD-10-CM | POA: Diagnosis not present

## 2024-01-20 DIAGNOSIS — R1116 Cannabis hyperemesis syndrome: Secondary | ICD-10-CM | POA: Diagnosis not present

## 2024-01-20 DIAGNOSIS — F12188 Cannabis abuse with other cannabis-induced disorder: Secondary | ICD-10-CM | POA: Diagnosis not present

## 2024-01-20 DIAGNOSIS — E860 Dehydration: Secondary | ICD-10-CM | POA: Insufficient documentation

## 2024-01-20 DIAGNOSIS — D72829 Elevated white blood cell count, unspecified: Secondary | ICD-10-CM | POA: Insufficient documentation

## 2024-01-20 DIAGNOSIS — F1210 Cannabis abuse, uncomplicated: Secondary | ICD-10-CM | POA: Insufficient documentation

## 2024-01-20 DIAGNOSIS — R1110 Vomiting, unspecified: Secondary | ICD-10-CM | POA: Diagnosis not present

## 2024-01-20 LAB — URINALYSIS, ROUTINE W REFLEX MICROSCOPIC
Bilirubin Urine: NEGATIVE
Glucose, UA: 50 mg/dL — AB
Ketones, ur: 5 mg/dL — AB
Nitrite: NEGATIVE
Protein, ur: 30 mg/dL — AB
Specific Gravity, Urine: 1.017 (ref 1.005–1.030)
pH: 6 (ref 5.0–8.0)

## 2024-01-20 LAB — COMPREHENSIVE METABOLIC PANEL WITH GFR
ALT: 18 U/L (ref 0–44)
AST: 36 U/L (ref 15–41)
Albumin: 5.1 g/dL — ABNORMAL HIGH (ref 3.5–5.0)
Alkaline Phosphatase: 71 U/L (ref 38–126)
Anion gap: 15 (ref 5–15)
BUN: 15 mg/dL (ref 6–20)
CO2: 23 mmol/L (ref 22–32)
Calcium: 9.8 mg/dL (ref 8.9–10.3)
Chloride: 103 mmol/L (ref 98–111)
Creatinine, Ser: 0.91 mg/dL (ref 0.44–1.00)
GFR, Estimated: >60 mL/min (ref >60)
Glucose, Bld: 141 mg/dL — ABNORMAL HIGH (ref 70–99)
Potassium: 3.9 mmol/L (ref 3.5–5.1)
Sodium: 141 mmol/L (ref 135–145)
Total Bilirubin: 0.8 mg/dL (ref 0.0–1.2)
Total Protein: 7.6 g/dL (ref 6.5–8.1)

## 2024-01-20 LAB — URINE DRUG SCREEN
Amphetamines: NEGATIVE
Barbiturates: NEGATIVE
Benzodiazepines: NEGATIVE
Cocaine: NEGATIVE
Fentanyl: NEGATIVE
Methadone Scn, Ur: NEGATIVE
Opiates: NEGATIVE
Tetrahydrocannabinol: POSITIVE — AB

## 2024-01-20 LAB — LIPASE, BLOOD: Lipase: 14 U/L (ref 11–51)

## 2024-01-20 LAB — CBC
HCT: 37.8 % (ref 36.0–46.0)
Hemoglobin: 12.7 g/dL (ref 12.0–15.0)
MCH: 29.7 pg (ref 26.0–34.0)
MCHC: 33.6 g/dL (ref 30.0–36.0)
MCV: 88.5 fL (ref 80.0–100.0)
Platelets: 238 K/uL (ref 150–400)
RBC: 4.27 MIL/uL (ref 3.87–5.11)
RDW: 12.4 % (ref 11.5–15.5)
WBC: 12.9 K/uL — ABNORMAL HIGH (ref 4.0–10.5)
nRBC: 0 % (ref 0.0–0.2)

## 2024-01-20 LAB — POC URINE PREG, ED: Preg Test, Ur: NEGATIVE

## 2024-01-20 MED ORDER — DROPERIDOL 2.5 MG/ML IJ SOLN
1.2500 mg | Freq: Once | INTRAMUSCULAR | Status: AC
  Administered 2024-01-20: 1.25 mg via INTRAVENOUS
  Filled 2024-01-20: qty 2

## 2024-01-20 MED ORDER — ONDANSETRON HCL 4 MG/2ML IJ SOLN: 4.0000 mg | Freq: Once | INTRAMUSCULAR | Status: DC

## 2024-01-20 MED ORDER — SODIUM CHLORIDE 0.9 % IV BOLUS
1000.0000 mL | Freq: Once | INTRAVENOUS | Status: AC
  Administered 2024-01-20: 1000 mL via INTRAVENOUS

## 2024-01-20 MED ORDER — ONDANSETRON HCL 4 MG PO TABS: 4.0000 mg | ORAL_TABLET | Freq: Four times a day (QID) | ORAL | 0 refills | Status: AC

## 2024-01-20 NOTE — ED Provider Notes (Signed)
 [place] EMERGENCY DEPARTMENT AT [facility] Provider Note   CSN: [id number] Arrival date & time: 01/20/24  1844     Patient presents with: Emesis   Jodi Lowe  is a 23 y.o. female.   Pt is a 23 yo female with pmhx significant for cannabinoid hyperemesis syndrome.  She woke up this am around 4 am with vomiting and chills.  She's been vomiting all day.  She is actively vomiting during exam and is not able to give much hx.         Prior to Admission medications   Medication Sig Start Date End Date Taking? Authorizing Provider  ondansetron  (ZOFRAN ) 4 MG tablet Take 1 tablet (4 mg total) by mouth every 6 (six) hours. 01/20/24  Yes Dean Clarity, MD    Allergies: Amoxicillin    Review of Systems  Constitutional:  Positive for chills.  Gastrointestinal:  Positive for nausea and vomiting.  All other systems reviewed and are negative.   Updated Vital Signs BP 108/62   Pulse 75   Temp 99.4 F (37.4 C) (Oral)   Resp 16   Wt 44.5 kg   LMP 01/06/2024 (Approximate)   SpO2 97%   BMI 15.82 kg/m   Physical Exam Vitals and nursing note reviewed.  Constitutional:      Appearance: Normal appearance. She is ill-appearing.  HENT:     Head: Normocephalic and atraumatic.     Right Ear: External ear normal.     Left Ear: External ear normal.     Nose: Nose normal.     Mouth/Throat:     Mouth: Mucous membranes are dry.  Eyes:     Extraocular Movements: Extraocular movements intact.     Conjunctiva/sclera: Conjunctivae normal.     Pupils: Pupils are equal, round, and reactive to light.  Cardiovascular:     Rate and Rhythm: Normal rate and regular rhythm.     Pulses: Normal pulses.     Heart sounds: Normal heart sounds.  Pulmonary:     Effort: Pulmonary effort is normal.     Breath sounds: Normal breath sounds.  Abdominal:     General: Abdomen is flat. Bowel sounds are normal.     Palpations: Abdomen is soft.     Comments: Actively vomiting   Musculoskeletal:        General: Normal range of motion.     Cervical back: Normal range of motion and neck supple.  Skin:    General: Skin is warm.     Capillary Refill: Capillary refill takes less than 2 seconds.  Neurological:     General: No focal deficit present.     Mental Status: She is alert and oriented to person, place, and time.  Psychiatric:        Mood and Affect: Mood normal.        Behavior: Behavior normal.     (all labs ordered are listed, but only abnormal results are displayed) Labs Reviewed  COMPREHENSIVE METABOLIC PANEL WITH GFR - Abnormal; Notable for the following components:      Result Value   Glucose, Bld 141 (*)    Albumin 5.1 (*)    All other components within normal limits  CBC - Abnormal; Notable for the following components:   WBC 12.9 (*)    All other components within normal limits  URINALYSIS, ROUTINE W REFLEX MICROSCOPIC - Abnormal; Notable for the following components:   APPearance HAZY (*)    Glucose, UA 50 (*)  Hgb urine dipstick SMALL (*)    Ketones, ur 5 (*)    Protein, ur 30 (*)    Leukocytes,Ua TRACE (*)    Bacteria, UA RARE (*)    All other components within normal limits  LIPASE, BLOOD  URINE DRUG SCREEN  POC URINE PREG, ED    EKG: None  Radiology: No results found.   Procedures   Medications Ordered in the ED  sodium chloride 0.9 % bolus 1,000 mL (0 mLs Intravenous Stopped 01/20/24 2231)  droperidol  (INAPSINE ) 2.5 MG/ML injection 1.25 mg (1.25 mg Intravenous Given 01/20/24 2126)                                    Medical Decision Making Amount and/or Complexity of Data Reviewed Labs: ordered.  Risk Prescription drug management.   This patient presents to the ED for concern of n/v, this involves an extensive number of treatment options, and is a complaint that carries with it a high risk of complications and morbidity.  The differential diagnosis includes pregnancy, infection, cannabinoid  hyperemesis   Co morbidities that complicate the patient evaluation  cannabinoid hyperemesis syndrome   Additional history obtained:  Additional history obtained from epic chart review    Lab Tests:  I Ordered, and personally interpreted labs.  The pertinent results include:  cbc with wbc sl elevated at 12., cmp nl other than glucose sl elevated at 141; lip nl; preg neg; ua + ketones and protein, no UTI   Medicines ordered and prescription drug management:  I ordered medication including ivfs/inapsine   for sx  Reevaluation of the patient after these medicines showed that the patient improved I have reviewed the patients home medicines and have made adjustments as needed   Critical Interventions:  Ivfs/antiemetics  Problem List / ED Course:  Cannabinoid hyperemesis syn:  pt does admit to frequent MJ use.  She is told this is likely the cause of her vomiting and to avoid using it.  She is feeling much better after fluids and meds.  She is stable for d/c.  Return if worse.  F/u with pcp.   Reevaluation:  After the interventions noted above, I reevaluated the patient and found that they have :improved   Social Determinants of Health:  Lives at home   Dispostion:  After consideration of the diagnostic results and the patients response to treatment, I feel that the patent would benefit from discharge with outpatient f/u.       Final diagnoses:  Dehydration  Cannabinoid hyperemesis syndrome    ED Discharge Orders          Ordered    ondansetron  (ZOFRAN ) 4 MG tablet  Every 6 hours        01/20/24 2235               Monnica Saltsman, MD 01/20/24 2236

## 2024-01-20 NOTE — ED Triage Notes (Signed)
 Pt complains of vomiting, chills and headache since 4 am. Denies diarrhea.

## 2024-01-20 NOTE — Discharge Instructions (Signed)
 Do not use marijuana.

## 2024-01-22 ENCOUNTER — Emergency Department (HOSPITAL_COMMUNITY)
Admission: EM | Admit: 2024-01-22 | Discharge: 2024-01-22 | Disposition: A | Discharge status: Home / Self Care | Attending: Emergency Medicine | Admitting: Emergency Medicine

## 2024-01-22 ENCOUNTER — Other Ambulatory Visit: Payer: Self-pay

## 2024-01-22 ENCOUNTER — Encounter (HOSPITAL_COMMUNITY): Payer: Self-pay

## 2024-01-22 DIAGNOSIS — R1084 Generalized abdominal pain: Secondary | ICD-10-CM | POA: Insufficient documentation

## 2024-01-22 DIAGNOSIS — R112 Nausea with vomiting, unspecified: Secondary | ICD-10-CM | POA: Insufficient documentation

## 2024-01-22 LAB — CBC WITH DIFFERENTIAL/PLATELET
Abs Immature Granulocytes: 0.03 K/uL (ref 0.00–0.07)
Basophils Absolute: 0 K/uL (ref 0.0–0.1)
Basophils Relative: 0 %
Eosinophils Absolute: 0 K/uL (ref 0.0–0.5)
Eosinophils Relative: 0 %
HCT: 35.2 % — ABNORMAL LOW (ref 36.0–46.0)
Hemoglobin: 12.1 g/dL (ref 12.0–15.0)
Immature Granulocytes: 0 %
Lymphocytes Relative: 19 %
Lymphs Abs: 1.5 K/uL (ref 0.7–4.0)
MCH: 30 pg (ref 26.0–34.0)
MCHC: 34.4 g/dL (ref 30.0–36.0)
MCV: 87.3 fL (ref 80.0–100.0)
Monocytes Absolute: 0.5 K/uL (ref 0.1–1.0)
Monocytes Relative: 6 %
Neutro Abs: 5.8 K/uL (ref 1.7–7.7)
Neutrophils Relative %: 75 %
Platelets: 207 K/uL (ref 150–400)
RBC: 4.03 MIL/uL (ref 3.87–5.11)
RDW: 12.5 % (ref 11.5–15.5)
WBC: 7.8 K/uL (ref 4.0–10.5)
nRBC: 0 % (ref 0.0–0.2)

## 2024-01-22 LAB — COMPREHENSIVE METABOLIC PANEL WITH GFR
ALT: 19 U/L (ref 0–44)
AST: 37 U/L (ref 15–41)
Albumin: 4.6 g/dL (ref 3.5–5.0)
Alkaline Phosphatase: 62 U/L (ref 38–126)
Anion gap: 12 (ref 5–15)
BUN: 12 mg/dL (ref 6–20)
CO2: 23 mmol/L (ref 22–32)
Calcium: 9.2 mg/dL (ref 8.9–10.3)
Chloride: 104 mmol/L (ref 98–111)
Creatinine, Ser: 0.82 mg/dL (ref 0.44–1.00)
GFR, Estimated: >60 mL/min (ref >60)
Glucose, Bld: 103 mg/dL — ABNORMAL HIGH (ref 70–99)
Potassium: 3.6 mmol/L (ref 3.5–5.1)
Sodium: 139 mmol/L (ref 135–145)
Total Bilirubin: 0.9 mg/dL (ref 0.0–1.2)
Total Protein: 6.7 g/dL (ref 6.5–8.1)

## 2024-01-22 LAB — LIPASE, BLOOD: Lipase: 14 U/L (ref 11–51)

## 2024-01-22 MED ORDER — ONDANSETRON HCL 4 MG/2ML IJ SOLN
4.0000 mg | Freq: Once | INTRAMUSCULAR | Status: AC
  Administered 2024-01-22: 4 mg via INTRAVENOUS
  Filled 2024-01-22: qty 2

## 2024-01-22 MED ORDER — SODIUM CHLORIDE 0.9 % IV BOLUS
1000.0000 mL | Freq: Once | INTRAVENOUS | Status: AC
  Administered 2024-01-22: 1000 mL via INTRAVENOUS

## 2024-01-22 MED ORDER — KETOROLAC TROMETHAMINE 30 MG/ML IJ SOLN
30.0000 mg | Freq: Once | INTRAMUSCULAR | Status: AC
  Administered 2024-01-22: 30 mg via INTRAVENOUS
  Filled 2024-01-22: qty 1

## 2024-01-22 MED ORDER — DROPERIDOL 2.5 MG/ML IJ SOLN
2.5000 mg | Freq: Once | INTRAMUSCULAR | Status: AC
  Administered 2024-01-22: 2.5 mg via INTRAVENOUS
  Filled 2024-01-22: qty 2

## 2024-01-22 NOTE — ED Triage Notes (Signed)
 Pt states she was seen yesterday for vomiting was sent home but states the vomiting has continued. States she was not able to pick up her meds from pharmacy.  Pt is now complaining of pain in the back of her neck as well. Pt ambulated independently to treatment room with steady gait.

## 2024-01-22 NOTE — ED Provider Notes (Signed)
 [place] EMERGENCY DEPARTMENT AT [facility] Provider Note   CSN: [id number] Arrival date & time: 01/22/24  9786     Patient presents with: Emesis   Jodi Lowe  is a 23 y.o. female.   Patient is a 23 year old female presenting with complaints of nausea and vomiting.  She states I cannot stop throwing up.  She was here yesterday with similar complaints and treated for suspected cannabinoid hyperemesis.  Patient was prescribed medications which she has been able to fill.  She began throwing up again tonight, so returns.  She describes generalized abdominal cramping.  No diarrhea or constipation.  No bloody stool or vomit.  No fevers or chills.  No ill contacts.  She does admit to daily marijuana use, but has not smoked it since becoming ill.       Prior to Admission medications   Medication Sig Start Date End Date Taking? Authorizing Provider  ondansetron  (ZOFRAN ) 4 MG tablet Take 1 tablet (4 mg total) by mouth every 6 (six) hours. 01/20/24   Dean Clarity, MD    Allergies: Amoxicillin    Review of Systems  All other systems reviewed and are negative.   Updated Vital Signs BP 108/65 (BP Location: Right Arm)   Pulse 61   Temp 98.2 F (36.8 C) (Oral)   Resp 17   Ht 5' 6 (1.676 m)   Wt 45 kg   LMP 01/06/2024 (Approximate)   SpO2 100%   BMI 16.01 kg/m   Physical Exam Vitals and nursing note reviewed.  Constitutional:      General: She is not in acute distress.    Appearance: She is well-developed. She is not diaphoretic.  HENT:     Head: Normocephalic and atraumatic.  Cardiovascular:     Rate and Rhythm: Normal rate and regular rhythm.     Heart sounds: No murmur heard.    No friction rub. No gallop.  Pulmonary:     Effort: Pulmonary effort is normal. No respiratory distress.     Breath sounds: Normal breath sounds. No wheezing.  Abdominal:     General: Bowel sounds are normal. There is no distension.     Palpations: Abdomen is soft.      Tenderness: There is no abdominal tenderness.  Musculoskeletal:        General: Normal range of motion.     Cervical back: Normal range of motion and neck supple.  Skin:    General: Skin is warm and dry.  Neurological:     General: No focal deficit present.     Mental Status: She is alert and oriented to person, place, and time.     (all labs ordered are listed, but only abnormal results are displayed) Labs Reviewed  COMPREHENSIVE METABOLIC PANEL WITH GFR  LIPASE, BLOOD  CBC WITH DIFFERENTIAL/PLATELET    EKG: None  Radiology: No results found.   Procedures   Medications Ordered in the ED  sodium chloride  0.9 % bolus 1,000 mL (has no administration in time range)  ondansetron  (ZOFRAN ) injection 4 mg (has no administration in time range)  ketorolac  (TORADOL ) 30 MG/ML injection 30 mg (has no administration in time range)                                    Medical Decision Making Amount and/or Complexity of Data Reviewed Labs: ordered.  Risk Prescription drug management.   Patient with history of  suspected cannabinoid hyperemesis presenting with vomiting and abdominal cramping.  Patient arrives here with stable vital signs and is afebrile.  Physical examination reveals mild generalized abdominal tenderness, but no peritoneal signs.  Laboratory studies obtained including CBC, CMP, and lipase, all of which are unremarkable.  There is no leukocytosis, no elevation of liver or pancreatic enzymes, and no electrolyte derangement.  Patient has been hydrated with normal saline.  She has received Toradol and Zofran  for pain and nausea followed by a dose of droperidol  and a second liter of fluids.  She is now resting comfortably and I believe can safely be discharged.  I have advised her to fill the prescription she was provided yesterday which she did not fill and follow-up as needed.     Final diagnoses:  None    ED Discharge Orders     None          Geroldine Berg, MD 01/22/24 [phone]

## 2024-01-22 NOTE — Discharge Instructions (Signed)
 No other prescriptions provided yesterday and begin taking them as needed for nausea.  Follow-up with primary doctor if symptoms persist.  Refrain from marijuana use.

## 2024-01-23 ENCOUNTER — Emergency Department (HOSPITAL_COMMUNITY)
Admission: EM | Admit: 2024-01-23 | Discharge: 2024-01-24 | Disposition: A | Discharge status: Home / Self Care | Attending: Emergency Medicine | Admitting: Emergency Medicine

## 2024-01-23 ENCOUNTER — Other Ambulatory Visit: Payer: Self-pay

## 2024-01-23 ENCOUNTER — Encounter (HOSPITAL_COMMUNITY): Payer: Self-pay | Admitting: *Deleted

## 2024-01-23 ENCOUNTER — Emergency Department (HOSPITAL_COMMUNITY)

## 2024-01-23 DIAGNOSIS — E876 Hypokalemia: Secondary | ICD-10-CM | POA: Insufficient documentation

## 2024-01-23 DIAGNOSIS — R1013 Epigastric pain: Secondary | ICD-10-CM | POA: Insufficient documentation

## 2024-01-23 DIAGNOSIS — R112 Nausea with vomiting, unspecified: Secondary | ICD-10-CM | POA: Insufficient documentation

## 2024-01-23 DIAGNOSIS — R1110 Vomiting, unspecified: Secondary | ICD-10-CM | POA: Diagnosis not present

## 2024-01-23 LAB — CBC WITH DIFFERENTIAL/PLATELET
Abs Immature Granulocytes: 0.03 K/uL (ref 0.00–0.07)
Basophils Absolute: 0 K/uL (ref 0.0–0.1)
Basophils Relative: 0 %
Eosinophils Absolute: 0 K/uL (ref 0.0–0.5)
Eosinophils Relative: 0 %
HCT: 37.3 % (ref 36.0–46.0)
Hemoglobin: 12.6 g/dL (ref 12.0–15.0)
Immature Granulocytes: 0 %
Lymphocytes Relative: 23 %
Lymphs Abs: 1.7 K/uL (ref 0.7–4.0)
MCH: 29.6 pg (ref 26.0–34.0)
MCHC: 33.8 g/dL (ref 30.0–36.0)
MCV: 87.8 fL (ref 80.0–100.0)
Monocytes Absolute: 0.6 K/uL (ref 0.1–1.0)
Monocytes Relative: 8 %
Neutro Abs: 5.1 K/uL (ref 1.7–7.7)
Neutrophils Relative %: 69 %
Platelets: 217 K/uL (ref 150–400)
RBC: 4.25 MIL/uL (ref 3.87–5.11)
RDW: 12.4 % (ref 11.5–15.5)
WBC: 7.5 K/uL (ref 4.0–10.5)
nRBC: 0 % (ref 0.0–0.2)

## 2024-01-23 LAB — COMPREHENSIVE METABOLIC PANEL WITH GFR
ALT: 24 U/L (ref 0–44)
AST: 35 U/L (ref 15–41)
Albumin: 4.8 g/dL (ref 3.5–5.0)
Alkaline Phosphatase: 65 U/L (ref 38–126)
Anion gap: 12 (ref 5–15)
BUN: 10 mg/dL (ref 6–20)
CO2: 25 mmol/L (ref 22–32)
Calcium: 9.1 mg/dL (ref 8.9–10.3)
Chloride: 102 mmol/L (ref 98–111)
Creatinine, Ser: 0.88 mg/dL (ref 0.44–1.00)
GFR, Estimated: >60 mL/min (ref >60)
Glucose, Bld: 91 mg/dL (ref 70–99)
Potassium: 3.4 mmol/L — ABNORMAL LOW (ref 3.5–5.1)
Sodium: 139 mmol/L (ref 135–145)
Total Bilirubin: 1.3 mg/dL — ABNORMAL HIGH (ref 0.0–1.2)
Total Protein: 7 g/dL (ref 6.5–8.1)

## 2024-01-23 LAB — URINALYSIS, ROUTINE W REFLEX MICROSCOPIC
Bilirubin Urine: NEGATIVE
Glucose, UA: 50 mg/dL — AB
Ketones, ur: 5 mg/dL — AB
Leukocytes,Ua: NEGATIVE
Nitrite: NEGATIVE
Protein, ur: 30 mg/dL — AB
Specific Gravity, Urine: 1.013 (ref 1.005–1.030)
pH: 5 (ref 5.0–8.0)

## 2024-01-23 LAB — LIPASE, BLOOD: Lipase: 15 U/L (ref 11–51)

## 2024-01-23 MED ORDER — IOHEXOL 9 MG/ML PO SOLN
500.0000 mL | ORAL | Status: AC
  Administered 2024-01-23: 500 mL via ORAL

## 2024-01-23 MED ORDER — PROMETHAZINE HCL 12.5 MG PO TABS: 25.0000 mg | ORAL_TABLET | Freq: Four times a day (QID) | ORAL | 0 refills | Status: AC | PRN

## 2024-01-23 MED ORDER — SODIUM CHLORIDE 0.9 % IV SOLN
12.5000 mg | Freq: Four times a day (QID) | INTRAVENOUS | Status: DC | PRN
  Administered 2024-01-23: 12.5 mg via INTRAVENOUS
  Filled 2024-01-23: qty 0.5

## 2024-01-23 MED ORDER — DIPHENHYDRAMINE HCL 50 MG/ML IJ SOLN
25.0000 mg | Freq: Once | INTRAMUSCULAR | Status: AC
  Administered 2024-01-23: 25 mg via INTRAVENOUS
  Filled 2024-01-23: qty 1

## 2024-01-23 MED ORDER — FAMOTIDINE IN NACL 20-0.9 MG/50ML-% IV SOLN
20.0000 mg | Freq: Once | INTRAVENOUS | Status: AC
Start: 2024-01-23 — End: 2024-01-23
  Administered 2024-01-23: 20 mg via INTRAVENOUS
  Filled 2024-01-23: qty 50

## 2024-01-23 MED ORDER — LACTATED RINGERS IV BOLUS
1000.0000 mL | Freq: Once | INTRAVENOUS | Status: AC
  Administered 2024-01-23: 1000 mL via INTRAVENOUS

## 2024-01-23 MED ORDER — IOHEXOL 300 MG/ML  SOLN
80.0000 mL | Freq: Once | INTRAMUSCULAR | Status: AC | PRN
Start: 2024-01-23 — End: 2024-01-23
  Administered 2024-01-23: 80 mL via INTRAVENOUS

## 2024-01-23 MED ORDER — METOCLOPRAMIDE HCL 5 MG/ML IJ SOLN
10.0000 mg | Freq: Once | INTRAMUSCULAR | Status: AC
  Administered 2024-01-23: 10 mg via INTRAVENOUS
  Filled 2024-01-23: qty 2

## 2024-01-23 MED ORDER — FAMOTIDINE 20 MG PO TABS: 20.0000 mg | ORAL_TABLET | Freq: Two times a day (BID) | ORAL | 0 refills | Status: AC

## 2024-01-23 MED ORDER — POTASSIUM CHLORIDE CRYS ER 20 MEQ PO TBCR
20.0000 meq | EXTENDED_RELEASE_TABLET | Freq: Once | ORAL | Status: AC
  Administered 2024-01-24: 20 meq via ORAL
  Filled 2024-01-23: qty 1

## 2024-01-23 NOTE — Discharge Instructions (Signed)
 Seen in ER today for continued nausea and vomiting.  Fortunately your blood work was reassuring, your CT scan did not show any acute abnormalities.  You were given fluids and nausea medicines here.  I am prescribing you promethazine for home for nausea and vomiting which is a medicine that you got to the IV here.  I am also prescribing Pepcid which helps with stomach irritation.  Please follow-up with gastroenterology.  Come back to the ER for new or worsening symptoms.

## 2024-01-23 NOTE — ED Provider Notes (Signed)
 [name] EMERGENCY DEPARTMENT AT [facility] Provider Note   CSN: [id number] Arrival date & time: 01/23/24  1702     Patient presents with: Emesis   Jodi Lowe  is a 23 y.o. female.  She has history of low back pain and uses THC daily.  Presents to the ER for evaluation of abdominal pain with nausea and vomiting.  She is present today with her mother, here for evaluation of nausea and vomiting.  Was seen in the ED on 01/20/2024 and treated with fluids and antiemetics and was feeling better and was thought to have cannabinoid hyperemesis syndrome, she has not been vaping since this visit, she is also seen at 3 AM on 11/19 for same symptoms and had reassuring blood work, was given Zofran , Toradol , droperidol  was discharged home.  She has been taking Zofran  since getting home and has had persistent vomiting again.  She is having the abdominal pain.  Denies headache, denies fever or chills.  She has been taking hot showers that initially very helpful but now are no longer giving her as much relief.  Reports mild neck pain as well, denies neck stiffness, no headache, no numbness tingling or weakness.    Emesis      Prior to Admission medications   Medication Sig Start Date End Date Taking? Authorizing Provider  famotidine  (PEPCID ) 20 MG tablet Take 1 tablet (20 mg total) by mouth 2 (two) times daily. 01/23/24  Yes Liyana Suniga A, PA-C  promethazine  (PHENERGAN ) 12.5 MG tablet Take 2 tablets (25 mg total) by mouth every 6 (six) hours as needed for nausea or vomiting. 01/23/24  Yes Jasiya Markie A, PA-C  ondansetron  (ZOFRAN ) 4 MG tablet Take 1 tablet (4 mg total) by mouth every 6 (six) hours. 01/20/24   Dean Clarity, MD    Allergies: Amoxicillin    Review of Systems  Gastrointestinal:  Positive for vomiting.    Updated Vital Signs BP 112/65   Pulse (!) 56   Temp 99.2 F (37.3 C) (Oral)   Resp 18   Ht 5' 6 (1.676 m)   Wt 45 kg   LMP 01/06/2024  (Approximate)   SpO2 100%   BMI 16.01 kg/m   Physical Exam Vitals and nursing note reviewed.  Constitutional:      General: She is not in acute distress.    Appearance: She is well-developed.  HENT:     Head: Normocephalic and atraumatic.  Eyes:     Conjunctiva/sclera: Conjunctivae normal.  Cardiovascular:     Rate and Rhythm: Normal rate and regular rhythm.     Heart sounds: No murmur heard. Pulmonary:     Effort: Pulmonary effort is normal. No respiratory distress.     Breath sounds: Normal breath sounds.  Abdominal:     Palpations: Abdomen is soft.     Tenderness: There is abdominal tenderness in the epigastric area. There is no right CVA tenderness or left CVA tenderness.  Musculoskeletal:        General: No swelling.     Cervical back: Neck supple.  Skin:    General: Skin is warm and dry.     Capillary Refill: Capillary refill takes less than 2 seconds.  Neurological:     Mental Status: She is alert.  Psychiatric:        Mood and Affect: Mood normal.     (all labs ordered are listed, but only abnormal results are displayed) Labs Reviewed  COMPREHENSIVE METABOLIC PANEL WITH GFR - Abnormal; Notable  for the following components:      Result Value   Potassium 3.4 (*)    Total Bilirubin 1.3 (*)    All other components within normal limits  URINALYSIS, ROUTINE W REFLEX MICROSCOPIC - Abnormal; Notable for the following components:   APPearance HAZY (*)    Glucose, UA 50 (*)    Hgb urine dipstick SMALL (*)    Ketones, ur 5 (*)    Protein, ur 30 (*)    Bacteria, UA RARE (*)    All other components within normal limits  CBC WITH DIFFERENTIAL/PLATELET  LIPASE, BLOOD    EKG: None  Radiology: CT ABDOMEN PELVIS W CONTRAST Result Date: 01/23/2024 CLINICAL DATA:  Vomiting x4 days. EXAM: CT ABDOMEN AND PELVIS WITH CONTRAST TECHNIQUE: Multidetector CT imaging of the abdomen and pelvis was performed using the standard protocol following bolus administration of  intravenous contrast. RADIATION DOSE REDUCTION: This exam was performed according to the departmental dose-optimization program which includes automated exposure control, adjustment of the mA and/or kV according to patient size and/or use of iterative reconstruction technique. CONTRAST:  80mL OMNIPAQUE  IOHEXOL  300 MG/ML  SOLN COMPARISON:  None Available. FINDINGS: Lower chest: No acute abnormality. Hepatobiliary: No focal liver abnormality is seen. No gallstones, gallbladder wall thickening, or biliary dilatation. Pancreas: Unremarkable. No pancreatic ductal dilatation or surrounding inflammatory changes. Spleen: Normal in size without focal abnormality. Adrenals/Urinary Tract: Adrenal glands are unremarkable. Kidneys are normal, without renal calculi, focal lesion, or hydronephrosis. Bladder is unremarkable. Stomach/Bowel: Stomach is within normal limits. Appendix appears normal. No evidence of bowel wall thickening, distention, or inflammatory changes. Vascular/Lymphatic: No significant vascular findings are present. No enlarged abdominal or pelvic lymph nodes. Reproductive: Uterus and bilateral adnexa are unremarkable. Other: No abdominal wall hernia or abnormality. No abdominopelvic ascites. Musculoskeletal: No acute or significant osseous findings. IMPRESSION: No acute or active process within the abdomen or pelvis. Electronically Signed   By: Suzen Dials M.D.   On: 01/23/2024 21:19     Procedures   Medications Ordered in the ED  iohexol  (OMNIPAQUE ) 9 MG/ML oral solution 500 mL (500 mLs Oral Contrast Given 01/23/24 2101)  promethazine  (PHENERGAN ) 12.5 mg in sodium chloride  0.9 % 50 mL IVPB (12.5 mg Intravenous New Bag/Given 01/23/24 2301)  potassium chloride  SA (KLOR-CON  M) CR tablet 20 mEq (has no administration in time range)  lactated ringers  bolus 1,000 mL (0 mLs Intravenous Stopped 01/23/24 2110)  metoCLOPramide  (REGLAN ) injection 10 mg (10 mg Intravenous Given 01/23/24 1858)   diphenhydrAMINE  (BENADRYL ) injection 25 mg (25 mg Intravenous Given 01/23/24 1857)  iohexol  (OMNIPAQUE ) 300 MG/ML solution 80 mL (80 mLs Intravenous Contrast Given 01/23/24 2101)  famotidine  (PEPCID ) IVPB 20 mg premix (0 mg Intravenous Stopped 01/23/24 2301)                                    Medical Decision Making Differential diagnosis includes but not limited to gastritis, gastroenteritis, cannabinoid hyperemesis, abdominal migraine, other  ED course: Patient presents to the ER for continued abdominal pain with nausea and vomiting, has been told it is likely Cannabinol hyperemesis, she has not smoked in the past 3 days but persistently has symptoms.  Zofran  is not helping at home.  She did not have imaging yet so wanted imaging of her abdomen pelvis which was reassuring.  Initially got some improvement with fluids and Reglan  and Benadryl , but then she did vomit 1 more time so  she was given promethazine  and she is now feeling much better.  She is tolerating p.o.  I discussed with patient we can send her home with promethazine  and have her follow-up with GI.  She was given some Pepcid  as she may have some mild gastritis for persistent vomiting at home.  Encouraged to continue with her cessation of cannabis as this can take months of abstinence to get full relief.  She was given strict return precautions.  Amount and/or Complexity of Data Reviewed External Data Reviewed: labs and notes.    Details: Reviewed labs from the visit from the 17th and 19th of this month.  Patient had negative pregnancy test on the 17th.  She had mild leukocytosis on the 17th, this had resolved on the 19th, and her electrolytes and kidney function have been normal. Labs: ordered. Radiology: ordered.  Risk Prescription drug management.        Final diagnoses:  Nausea and vomiting, unspecified vomiting type    ED Discharge Orders          Ordered    famotidine  (PEPCID ) 20 MG tablet  2 times daily         01/23/24 2337    promethazine  (PHENERGAN ) 12.5 MG tablet  Every 6 hours PRN        01/23/24 2337               Tyrece Vanterpool A, PA-C 01/23/24 2341    Patsey Lot, MD 01/24/24 2303

## 2024-01-23 NOTE — ED Triage Notes (Signed)
 Pt complains of vomiting x 4 days and this is pt 3rd visit. Pt states she has been taking medication that was prescribed but it is not helping. Pt denies smoking weed since she has been vomiting. Did a virtual visit with PCP today and was told to come to ER to figure out what is causing the vomiting.

## 2024-01-24 MED ORDER — PROCHLORPERAZINE MALEATE 10 MG PO TABS: 10.0000 mg | ORAL_TABLET | Freq: Two times a day (BID) | ORAL | 0 refills | Status: AC | PRN

## 2024-01-24 MED ORDER — PROCHLORPERAZINE EDISYLATE 10 MG/2ML IJ SOLN
10.0000 mg | Freq: Once | INTRAMUSCULAR | Status: AC
  Administered 2024-01-24: 10 mg via INTRAVENOUS
  Filled 2024-01-24: qty 2
